# Patient Record
Sex: Female | Born: 1950 | Race: White | Hispanic: No | Smoking: Former smoker
Health system: Southern US, Community
[De-identification: ages and names within clinical notes are randomized; demographics above are authoritative.]

## PROBLEM LIST (undated history)

## (undated) DIAGNOSIS — R569 Unspecified convulsions: Secondary | ICD-10-CM

## (undated) DIAGNOSIS — J449 Chronic obstructive pulmonary disease, unspecified: Secondary | ICD-10-CM

## (undated) DIAGNOSIS — F1110 Opioid abuse, uncomplicated: Secondary | ICD-10-CM

## (undated) DIAGNOSIS — J45909 Unspecified asthma, uncomplicated: Secondary | ICD-10-CM

## (undated) DIAGNOSIS — I4891 Unspecified atrial fibrillation: Secondary | ICD-10-CM

## (undated) DIAGNOSIS — U071 COVID-19: Secondary | ICD-10-CM

## (undated) DIAGNOSIS — I2510 Atherosclerotic heart disease of native coronary artery without angina pectoris: Secondary | ICD-10-CM

## (undated) HISTORY — PX: BARIATRIC SURGERY: SHX1103

## (undated) HISTORY — PX: COLON SURGERY: SHX602

## (undated) HISTORY — PX: CARDIAC SURGERY: SHX584

## (undated) HISTORY — PX: APPENDECTOMY: SHX54

---

## 2021-03-03 ENCOUNTER — Encounter (HOSPITAL_BASED_OUTPATIENT_CLINIC_OR_DEPARTMENT_OTHER): Payer: Self-pay | Admitting: *Deleted

## 2021-03-03 ENCOUNTER — Other Ambulatory Visit: Payer: Self-pay

## 2021-03-03 ENCOUNTER — Emergency Department (HOSPITAL_BASED_OUTPATIENT_CLINIC_OR_DEPARTMENT_OTHER)
Admission: EM | Admit: 2021-03-03 | Discharge: 2021-03-03 | Disposition: A | Discharge status: Home / Self Care | Payer: Medicare Other | Attending: Emergency Medicine | Admitting: Emergency Medicine

## 2021-03-03 ENCOUNTER — Emergency Department (HOSPITAL_BASED_OUTPATIENT_CLINIC_OR_DEPARTMENT_OTHER): Payer: Medicare Other | Admitting: Radiology

## 2021-03-03 DIAGNOSIS — I2510 Atherosclerotic heart disease of native coronary artery without angina pectoris: Secondary | ICD-10-CM | POA: Insufficient documentation

## 2021-03-03 DIAGNOSIS — J449 Chronic obstructive pulmonary disease, unspecified: Secondary | ICD-10-CM | POA: Insufficient documentation

## 2021-03-03 DIAGNOSIS — J4521 Mild intermittent asthma with (acute) exacerbation: Secondary | ICD-10-CM | POA: Diagnosis not present

## 2021-03-03 DIAGNOSIS — Z20822 Contact with and (suspected) exposure to covid-19: Secondary | ICD-10-CM | POA: Insufficient documentation

## 2021-03-03 DIAGNOSIS — Z87891 Personal history of nicotine dependence: Secondary | ICD-10-CM | POA: Insufficient documentation

## 2021-03-03 DIAGNOSIS — Z79899 Other long term (current) drug therapy: Secondary | ICD-10-CM | POA: Insufficient documentation

## 2021-03-03 DIAGNOSIS — R052 Subacute cough: Secondary | ICD-10-CM

## 2021-03-03 DIAGNOSIS — R0602 Shortness of breath: Secondary | ICD-10-CM | POA: Insufficient documentation

## 2021-03-03 DIAGNOSIS — R059 Cough, unspecified: Secondary | ICD-10-CM | POA: Diagnosis present

## 2021-03-03 HISTORY — DX: COVID-19: U07.1

## 2021-03-03 HISTORY — DX: Unspecified convulsions: R56.9

## 2021-03-03 HISTORY — DX: Unspecified atrial fibrillation: I48.91

## 2021-03-03 HISTORY — DX: Opioid abuse, uncomplicated: F11.10

## 2021-03-03 HISTORY — DX: Chronic obstructive pulmonary disease, unspecified: J44.9

## 2021-03-03 HISTORY — DX: Unspecified asthma, uncomplicated: J45.909

## 2021-03-03 HISTORY — DX: Atherosclerotic heart disease of native coronary artery without angina pectoris: I25.10

## 2021-03-03 LAB — BASIC METABOLIC PANEL
Anion gap: 8 (ref 5–15)
BUN: 15 mg/dL (ref 8–23)
CO2: 29 mmol/L (ref 22–32)
Calcium: 8.9 mg/dL (ref 8.9–10.3)
Chloride: 103 mmol/L (ref 98–111)
Creatinine, Ser: 0.56 mg/dL (ref 0.44–1.00)
GFR, Estimated: >60 mL/min (ref >60)
Glucose, Bld: 105 mg/dL — ABNORMAL HIGH (ref 70–99)
Potassium: 3.3 mmol/L — ABNORMAL LOW (ref 3.5–5.1)
Sodium: 140 mmol/L (ref 135–145)

## 2021-03-03 LAB — CBC WITH DIFFERENTIAL/PLATELET
Abs Immature Granulocytes: 0.01 10*3/uL (ref 0.00–0.07)
Basophils Absolute: 0.1 10*3/uL (ref 0.0–0.1)
Basophils Relative: 1 %
Eosinophils Absolute: 0.1 10*3/uL (ref 0.0–0.5)
Eosinophils Relative: 3 %
HCT: 37.5 % (ref 36.0–46.0)
Hemoglobin: 11.9 g/dL — ABNORMAL LOW (ref 12.0–15.0)
Immature Granulocytes: 0 %
Lymphocytes Relative: 36 %
Lymphs Abs: 1.5 10*3/uL (ref 0.7–4.0)
MCH: 28.3 pg (ref 26.0–34.0)
MCHC: 31.7 g/dL (ref 30.0–36.0)
MCV: 89.3 fL (ref 80.0–100.0)
Monocytes Absolute: 0.4 10*3/uL (ref 0.1–1.0)
Monocytes Relative: 10 %
Neutro Abs: 2.1 10*3/uL (ref 1.7–7.7)
Neutrophils Relative %: 50 %
Platelets: 246 10*3/uL (ref 150–400)
RBC: 4.2 MIL/uL (ref 3.87–5.11)
RDW: 13.4 % (ref 11.5–15.5)
WBC: 4.3 10*3/uL (ref 4.0–10.5)
nRBC: 0 % (ref 0.0–0.2)

## 2021-03-03 LAB — BRAIN NATRIURETIC PEPTIDE: B Natriuretic Peptide: 27.2 pg/mL (ref 0.0–100.0)

## 2021-03-03 LAB — RESP PANEL BY RT-PCR (FLU A&B, COVID) ARPGX2
Influenza A by PCR: NEGATIVE
Influenza B by PCR: NEGATIVE
SARS Coronavirus 2 by RT PCR: NEGATIVE

## 2021-03-03 MED ORDER — GUAIFENESIN 100 MG/5ML PO LIQD
5.0000 mL | Freq: Once | ORAL | Status: AC
  Administered 2021-03-03: 5 mL via ORAL

## 2021-03-03 MED ORDER — MAGNESIUM SULFATE 2 GM/50ML IV SOLN
2.0000 g | Freq: Once | INTRAVENOUS | Status: AC
  Administered 2021-03-03: 2 g via INTRAVENOUS
  Filled 2021-03-03: qty 50

## 2021-03-03 MED ORDER — DEXTROMETHORPHAN POLISTIREX ER 30 MG/5ML PO SUER: 15.0000 mg | ORAL | 0 refills | Status: AC | PRN

## 2021-03-03 MED ORDER — IPRATROPIUM-ALBUTEROL 0.5-2.5 (3) MG/3ML IN SOLN
RESPIRATORY_TRACT | Status: AC
  Administered 2021-03-03: 3 mL
  Filled 2021-03-03: qty 3

## 2021-03-03 MED ORDER — METHYLPREDNISOLONE SODIUM SUCC 125 MG IJ SOLR
80.0000 mg | Freq: Once | INTRAMUSCULAR | Status: AC
  Administered 2021-03-03: 80 mg via INTRAVENOUS
  Filled 2021-03-03: qty 2

## 2021-03-03 MED ORDER — ALBUTEROL SULFATE HFA 108 (90 BASE) MCG/ACT IN AERS: 2.0000 | INHALATION_SPRAY | RESPIRATORY_TRACT | Status: DC | PRN

## 2021-03-03 MED ORDER — ALBUTEROL SULFATE (2.5 MG/3ML) 0.083% IN NEBU
INHALATION_SOLUTION | RESPIRATORY_TRACT | Status: AC
  Administered 2021-03-03: 2.5 mg
  Filled 2021-03-03: qty 3

## 2021-03-03 MED ORDER — IPRATROPIUM-ALBUTEROL 0.5-2.5 (3) MG/3ML IN SOLN
3.0000 mL | RESPIRATORY_TRACT | Status: AC
  Administered 2021-03-03 (×2): 3 mL via RESPIRATORY_TRACT
  Filled 2021-03-03: qty 3

## 2021-03-03 MED ORDER — IPRATROPIUM-ALBUTEROL 0.5-2.5 (3) MG/3ML IN SOLN: 3.0000 mL | RESPIRATORY_TRACT | Status: DC

## 2021-03-03 MED ORDER — IPRATROPIUM-ALBUTEROL 0.5-2.5 (3) MG/3ML IN SOLN
3.0000 mL | Freq: Once | RESPIRATORY_TRACT | Status: AC
  Administered 2021-03-03: 3 mL via RESPIRATORY_TRACT

## 2021-03-03 MED ORDER — PREDNISONE 10 MG (21) PO TBPK: ORAL_TABLET | Freq: Every day | ORAL | 0 refills | Status: AC

## 2021-03-03 MED ORDER — KETOROLAC TROMETHAMINE 15 MG/ML IJ SOLN
15.0000 mg | Freq: Once | INTRAMUSCULAR | Status: AC
  Administered 2021-03-03: 15 mg via INTRAVENOUS
  Filled 2021-03-03: qty 1

## 2021-03-03 MED ORDER — ALBUTEROL SULFATE (2.5 MG/3ML) 0.083% IN NEBU: 2.5000 mg | INHALATION_SOLUTION | Freq: Once | RESPIRATORY_TRACT | Status: DC

## 2021-03-03 NOTE — ED Notes (Signed)
Patient has been stuck x2 by Paramedic, x1 by this RN, and by charge RN without ability to obtain IV access.

## 2021-03-03 NOTE — ED Notes (Signed)
Patient's son very frustrated at this RN. He reports he is exhausted and he wants's her admitted. Patient's son making sarcastic comments regarding prescribed medication and stated "Oh boy steroids, the medication that doesn't actually fix anything and will make you feel better just for a minute". Patient's son tearful stating he "needs a break" and the patient "Just fell in his lap and he is expected to take care of her and they have two teenagers at home". Explained to patient about following up with Pulmonology and primary care and patient's son became more agitated "Her primary care doctors are two hours away!" Patient requesting assistance with home health and potential placement in the future.

## 2021-03-03 NOTE — ED Triage Notes (Signed)
Three weeks ago Covid +, went to rehab, now lives with son, this am could not stop coughing, labored resp.

## 2021-03-03 NOTE — ED Provider Notes (Signed)
[facility] EMERGENCY DEPT Provider Note   CSN: [id number] Arrival date & time: 03/03/21  1103     History Chief Complaint  Patient presents with   Cough   Shortness of Breath    Sandra Sweeney is a 70 y.o. female.  This is a 70 y.o. female with significant medical history as below, including copd, asthma recent covid 19 infection who presents to the ED with complaint of cough, mild dib. Symptoms ongoing for around 3 weeks since diagnosed with covid 19. She was recently discharged home from SNF to care of her son. Dose have ongoing cough not acutely improved with home inhaler. She has not tried any anti-tussives. Her cough is non-productive. No fevers or chills. She is tolerating oral intake and no change to bowel/bladder function. No chest pain. Compliant with home medications.   The history is provided by the patient and a relative. No language interpreter was used.  Cough Cough characteristics:  Non-productive Severity:  Mild Onset quality:  Gradual Duration:  3 weeks Timing:  Intermittent Progression:  Worsening Context: upper respiratory infection   Context: not animal exposure and not occupational exposure   Worsened by:  Exposure to cold air Ineffective treatments:  Beta-agonist inhaler Associated symptoms: shortness of breath   Associated symptoms: no chest pain, no chills, no fever, no headaches and no rash   Shortness of Breath Associated symptoms: cough   Associated symptoms: no abdominal pain, no chest pain, no fever, no headaches, no rash and no vomiting       Past Medical History:  Diagnosis Date   Asthma    Atrial fibrillation (HCC)    COPD (chronic obstructive pulmonary disease) (HCC)    Coronary artery disease    COVID    Narcotic abuse (HCC)    Seizures (HCC)     There are no problems to display for this patient.   Past Surgical History:  Procedure Laterality Date   APPENDECTOMY     BARIATRIC SURGERY     CARDIAC SURGERY      COLON SURGERY       OB History   No obstetric history on file.     No family history on file.  Social History   Tobacco Use   Smoking status: Former    Types: Cigarettes    Quit date: 12/28/1980    Years since quitting: 40.2   Smokeless tobacco: Never  Vaping Use   Vaping Use: Never used  Substance Use Topics   Alcohol use: Never   Drug use: Never    Home Medications Prior to Admission medications   Medication Sig Start Date End Date Taking? Authorizing Provider  albuterol (VENTOLIN HFA) 108 (90 Base) MCG/ACT inhaler Inhale 1-2 puffs into the lungs every 6 (six) hours as needed for wheezing or shortness of breath.   Yes [provider]  amitriptyline (ELAVIL) 25 MG tablet Take 25 mg by mouth at bedtime.   Yes [provider]  amLODipine (NORVASC) 5 MG tablet Take 5 mg by mouth daily.   Yes [provider]  dextromethorphan (DELSYM) 30 MG/5ML liquid Take 2.5 mLs (15 mg total) by mouth as needed for cough. 03/03/21  Yes Tanda Rockers A, DO  guaiFENesin-dextromethorphan (ROBITUSSIN DM) 100-10 MG/5ML syrup Take 5 mLs by mouth every 4 (four) hours as needed for cough.   Yes [provider]  ibuprofen (ADVIL) 200 MG tablet Take 200 mg by mouth every 6 (six) hours as needed.   Yes [provider]  losartan (COZAAR) 100 MG tablet Take 100 mg by mouth daily.   Yes [provider]  metoprolol tartrate (LOPRESSOR) 25 MG tablet Take 12.5 mg by mouth 2 (two) times daily.   Yes [provider]  montelukast (SINGULAIR) 10 MG tablet Take 10 mg by mouth at bedtime.   Yes [provider]  omeprazole (PRILOSEC) 40 MG capsule Take 40 mg by mouth 2 (two) times daily.   Yes [provider]  oxybutynin (DITROPAN-XL) 10 MG 24 hr tablet Take 10 mg by mouth daily.   Yes [provider]  predniSONE (STERAPRED UNI-PAK 21 TAB) 10 MG (21) TBPK tablet Take by mouth daily. Take 6 tabs by mouth daily  for 2 days, then 5 tabs  for 2 days, then 4 tabs for 2 days, then 3 tabs for 2 days, 2 tabs for 2 days, then 1 tab by mouth daily for 2 days 03/03/21  Yes Wynona Dove A, DO  rOPINIRole (REQUIP) 0.5 MG tablet Take 0.5 mg by mouth at bedtime.   Yes [provider]  traMADol (ULTRAM) 50 MG tablet Take 50 mg by mouth daily.   Yes [provider]    Allergies    Cinnamon, Novocain [procaine], and Sulfa antibiotics  Review of Systems   Review of Systems  Constitutional:  Negative for chills and fever.  HENT:  Negative for facial swelling and trouble swallowing.   Eyes:  Negative for photophobia and visual disturbance.  Respiratory:  Positive for cough and shortness of breath.   Cardiovascular:  Negative for chest pain and palpitations.  Gastrointestinal:  Negative for abdominal pain, nausea and vomiting.  Endocrine: Negative for polydipsia and polyuria.  Genitourinary:  Negative for difficulty urinating and hematuria.  Musculoskeletal:  Negative for gait problem and joint swelling.  Skin:  Negative for pallor and rash.  Neurological:  Negative for syncope and headaches.  Psychiatric/Behavioral:  Negative for agitation and confusion.    Physical Exam Updated Vital Signs BP (!) 154/60   Pulse (!) 103   Temp 98.2 F (36.8 C)   Resp (!) 22   Ht 5\' 2"  (1.575 m)   Wt 85.5 kg   SpO2 93%   BMI 34.46 kg/m   Physical Exam Vitals and nursing note reviewed.  Constitutional:      General: She is not in acute distress.    Appearance: Normal appearance. She is obese.  HENT:     Head: Normocephalic and atraumatic.     Right Ear: External ear normal.     Left Ear: External ear normal.     Nose: Nose normal.     Mouth/Throat:     Mouth: Mucous membranes are moist.  Eyes:     General: No scleral icterus.       Right eye: No discharge.        Left eye: No discharge.  Cardiovascular:     Rate and Rhythm: Normal rate and regular rhythm.     Pulses: Normal pulses.     Heart sounds: Normal heart  sounds.  Pulmonary:     Effort: Pulmonary effort is normal. Tachypnea present. No respiratory distress.     Breath sounds: Decreased breath sounds and wheezing present.  Abdominal:     General: Abdomen is flat.     Tenderness: There is no abdominal tenderness.  Musculoskeletal:        General: Normal range of motion.     Cervical back: Normal range of motion.     Right  lower leg: No edema.     Left lower leg: No edema.  Skin:    General: Skin is warm and dry.     Capillary Refill: Capillary refill takes less than 2 seconds.  Neurological:     Mental Status: She is alert.  Psychiatric:        Mood and Affect: Mood normal.        Behavior: Behavior normal.    ED Results / Procedures / Treatments   Labs (all labs ordered are listed, but only abnormal results are displayed) Labs Reviewed  BASIC METABOLIC PANEL - Abnormal; Notable for the following components:      Result Value   Potassium 3.3 (*)    Glucose, Bld 105 (*)    All other components within normal limits  CBC WITH DIFFERENTIAL/PLATELET - Abnormal; Notable for the following components:   Hemoglobin 11.9 (*)    All other components within normal limits  RESP PANEL BY RT-PCR (FLU A&B, COVID) ARPGX2  BRAIN NATRIURETIC PEPTIDE    EKG EKG Interpretation  Date/Time:  Tuesday March 03 2021 11:19:27 EST Ventricular Rate:  98 PR Interval:  136 QRS Duration: 144 QT Interval:  394 QTC Calculation: 503 R Axis:   -44 Text Interpretation: Normal sinus rhythm Right bundle branch block Abnormal ECG No prior tracing available Confirmed by Wynona Dove ([id number]) on 03/03/2021 3:10:24 PM  Radiology DG Chest 2 View  Result Date: 03/03/2021 CLINICAL DATA:  Shortness of breath EXAM: CHEST - 2 VIEW COMPARISON:  None. FINDINGS: Median sternotomy wires are noted. The cardiomediastinal silhouette is within normal limits. Lung volumes are low. There is no focal consolidation or pulmonary edema. There is no pleural effusion or  pneumothorax. There is marked focal kyphosis at the thoracolumbar junction with mild compression deformity of the T12 vertebral body. There is grade 1 retrolisthesis of L1 on L2. Bilateral reverse shoulder arthroplasty hardware is noted. The right humeral head prosthesis is superiorly displaced which may be chronic. IMPRESSION: 1. Low lung volumes. Otherwise, no radiographic evidence of acute cardiopulmonary process. 2. Marked focal kyphosis at the thoracolumbar junction with mild age-indeterminate compression deformity of the T12 vertebral body and grade 1 retrolisthesis of L1 on L2. 3. Bilateral verse shoulder arthroplasty. The right humeral head prosthesis is superiorly displaced relative to the glenoid, which may be chronic. Electronically Signed   By: Valetta Mole M.D.   On: 03/03/2021 11:47    Procedures Procedures   Medications Ordered in ED Medications  albuterol (VENTOLIN HFA) 108 (90 Base) MCG/ACT inhaler 2 puff (has no administration in time range)  albuterol (PROVENTIL) (2.5 MG/3ML) 0.083% nebulizer solution 2.5 mg (2.5 mg Nebulization Not Given 03/03/21 1310)  methylPREDNISolone sodium succinate (SOLU-MEDROL) 125 mg/2 mL injection 80 mg (80 mg Intravenous Given 03/03/21 1414)  magnesium sulfate IVPB 2 g 50 mL (0 g Intravenous Stopped 03/03/21 1443)  ipratropium-albuterol (DUONEB) 0.5-2.5 (3) MG/3ML nebulizer solution 3 mL (3 mLs Nebulization Given 03/03/21 1310)  ipratropium-albuterol (DUONEB) 0.5-2.5 (3) MG/3ML nebulizer solution (3 mLs  Given 03/03/21 1310)  albuterol (PROVENTIL) (2.5 MG/3ML) 0.083% nebulizer solution (2.5 mg  Given 03/03/21 1308)  ketorolac (TORADOL) 15 MG/ML injection 15 mg (15 mg Intravenous Given 03/03/21 1516)  ipratropium-albuterol (DUONEB) 0.5-2.5 (3) MG/3ML nebulizer solution 3 mL (3 mLs Nebulization Given 03/03/21 1452)  guaiFENesin (ROBITUSSIN) 100 MG/5ML liquid 5 mL (5 mLs Oral Given 03/03/21 1515)    ED Course  I have reviewed the triage vital signs and the  nursing notes.  Pertinent labs &  imaging results that were available during my care of the patient were reviewed by me and considered in my medical decision making (see chart for details).    MDM Rules/Calculators/A&P                           CC: cough  This patient complains of cough; this involves an extensive number of treatment options and is a complaint that carries with it a high risk of complications and morbidity. Vital signs were reviewed. Serious etiologies considered.  Record review:   Previous records obtained and reviewed   Additional history obtained from son  Work up as above, notable for:  Labs & imaging results that were available during my care of the patient were reviewed by me and considered in my medical decision making.   I ordered imaging studies which included CXR and I independently visualized and interpreted imaging which showed stable  Management: Pt given nebulized breathing treatments, steroids, anti tussive, toradol  Reassessment:  Pt does report improvement to her cough and breathing, wheezing has resolved on auscultation. She is able to tolerate oral intake without difficulty, speaking clearly with full sentences, she is not hypoxic and remains on room air.   Etiology of cough/dib likely 2/2 history of asthma/copd and recent covid 19 infection with likely some residual symptoms. Discussed supportive care at home including anti-tussives and short taper of steroids, o/p f/u with PCP and pulmonary. TOC was also consulted per son request to help with her medications, pcp needs, and potentially to help with home care if able.  Pt does not need refills on inhalers  The patient improved significantly and was discharged in stable condition. Detailed discussions were had with the patient regarding current findings, and need for close f/u with PCP or on call doctor. The patient has been instructed to return immediately if the symptoms worsen in any way for  re-evaluation. Patient verbalized understanding and is in agreement with current care plan. All questions answered prior to discharge.           This chart was dictated using voice recognition software.  Despite best efforts to proofread,  errors can occur which can change the documentation meaning.  Final Clinical Impression(s) / ED Diagnoses Final diagnoses:  Subacute cough  Mild intermittent asthma with exacerbation    Rx / DC Orders ED Discharge Orders          Ordered    dextromethorphan (DELSYM) 30 MG/5ML liquid  As needed        03/03/21 1544    predniSONE (STERAPRED UNI-PAK 21 TAB) 10 MG (21) TBPK tablet  Daily        03/03/21 1544             Jeanell Sparrow, DO 03/03/21 1616

## 2022-12-04 IMAGING — DX DG CHEST 2V
2 series · 2 of 2 positions shown · non-contrast
Comparison: None.

CLINICAL DATA: Shortness of breath

EXAM:
CHEST - 2 VIEW

[chest pa]
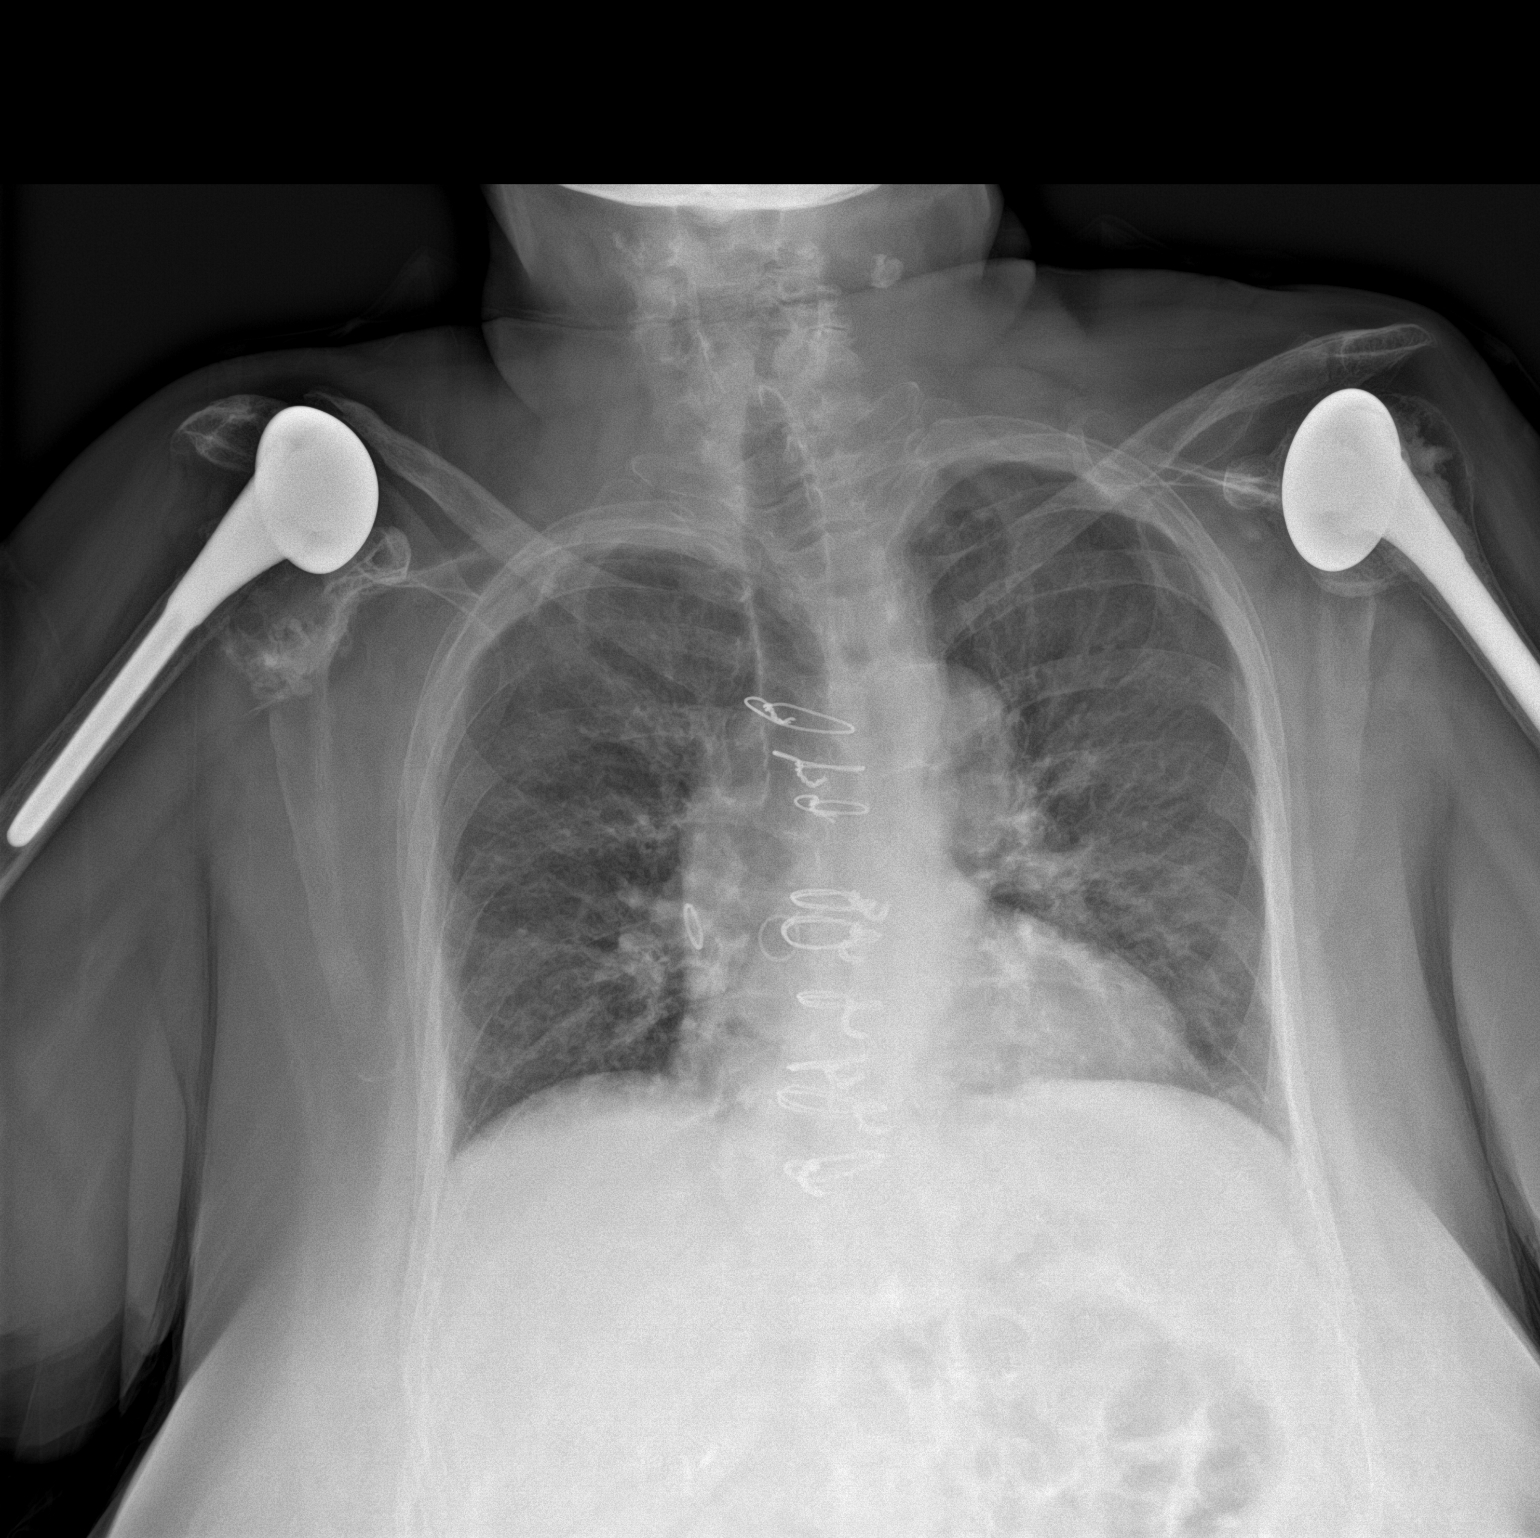

[chest lat]
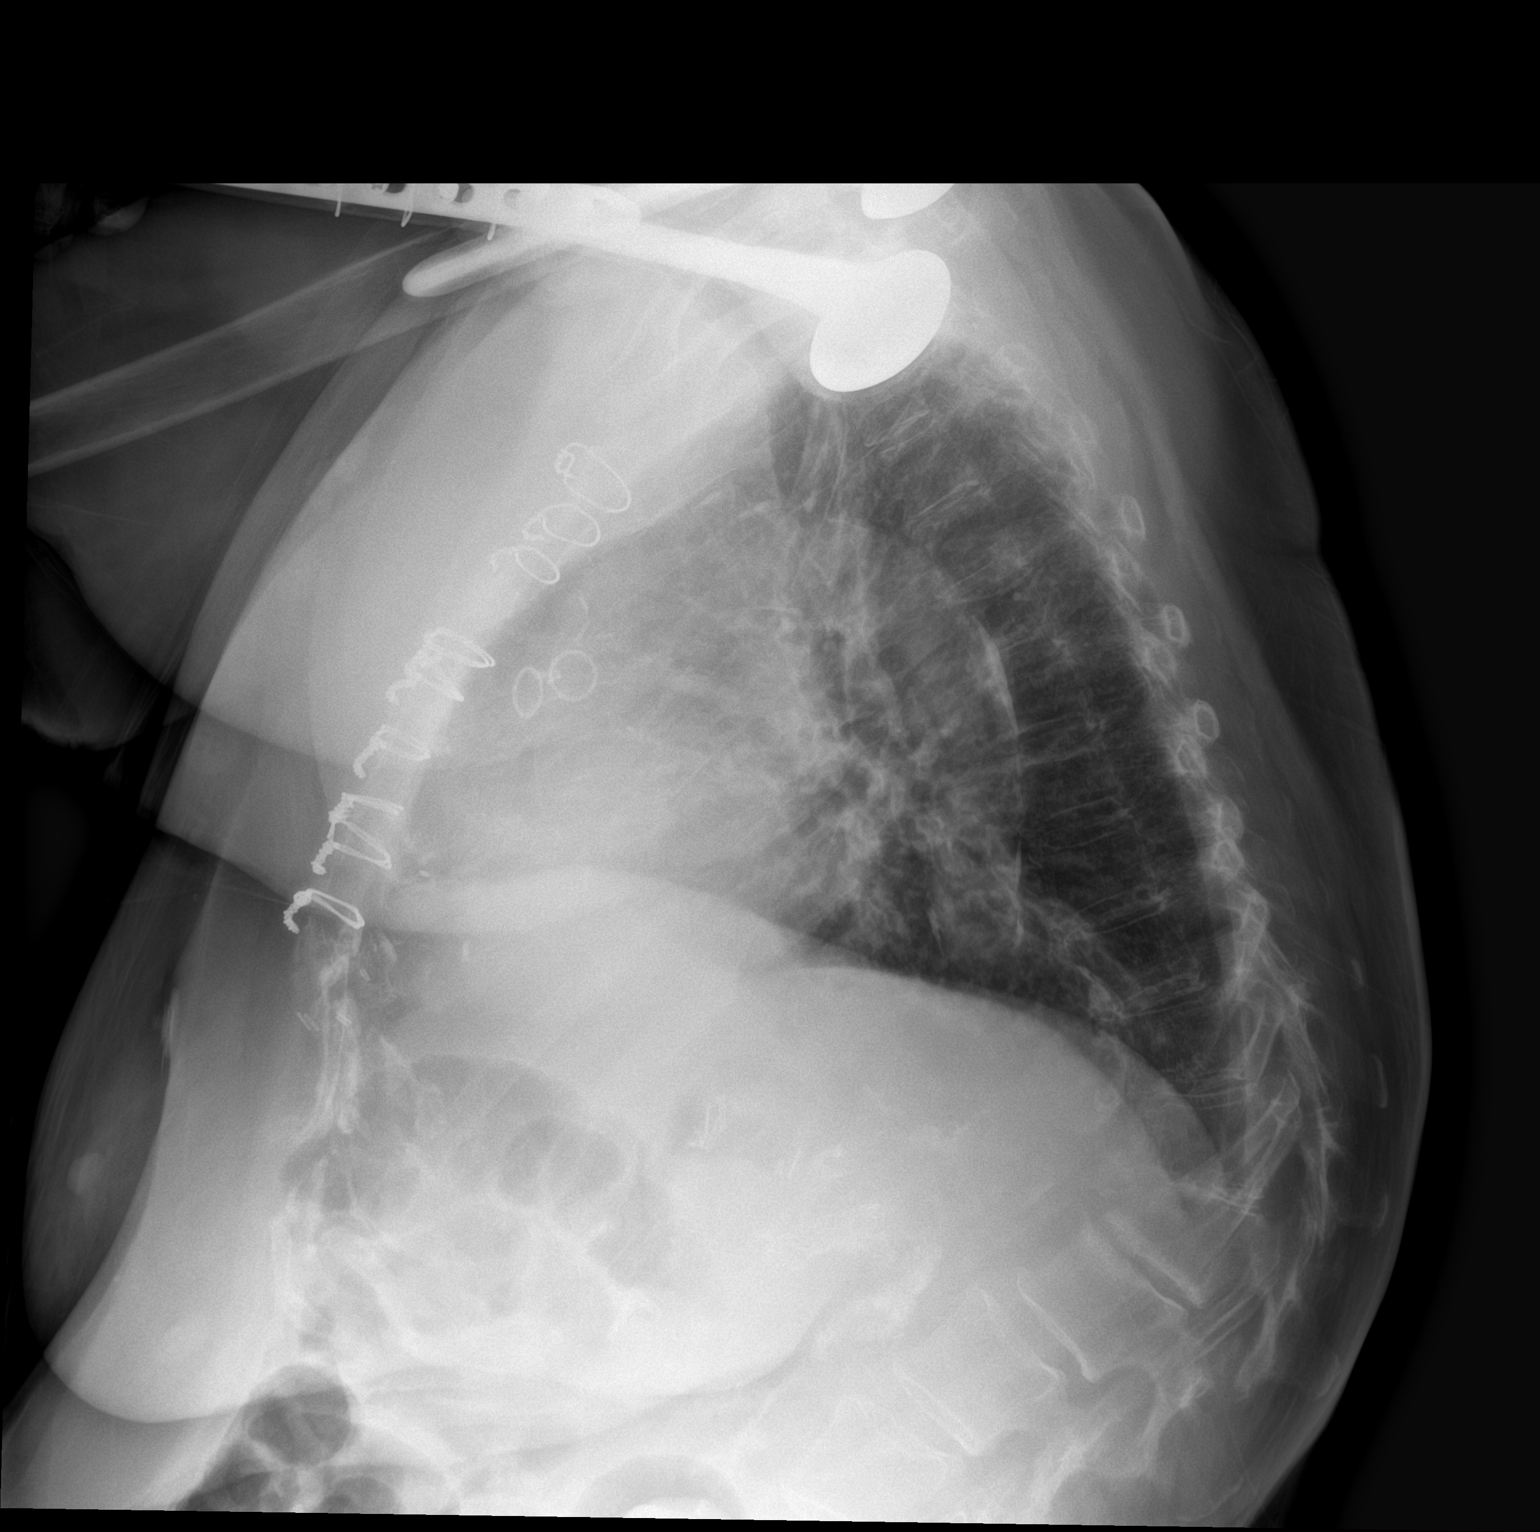

[2 of 2 positions shown; findings below may reference images not displayed]

FINDINGS: Median sternotomy wires are noted. The cardiomediastinal silhouette
is within normal limits.

Lung volumes are low. There is no focal consolidation or pulmonary
edema. There is no pleural effusion or pneumothorax.

There is marked focal kyphosis at the thoracolumbar junction with
mild compression deformity of the T12 vertebral body. There is grade
1 retrolisthesis of L1 on L2. Bilateral reverse shoulder
arthroplasty hardware is noted. The right humeral head prosthesis is
superiorly displaced which may be chronic.
IMPRESSION: 1. Low lung volumes. Otherwise, no radiographic evidence of acute
cardiopulmonary process.
2. Marked focal kyphosis at the thoracolumbar junction with mild
age-indeterminate compression deformity of the T12 vertebral body
and grade 1 retrolisthesis of L1 on L2.
3. Bilateral verse shoulder arthroplasty. The right humeral head
prosthesis is superiorly displaced relative to the glenoid, which
may be chronic.
